# Patient Record
Sex: Male | Born: 1980 | Hispanic: No | Marital: Married | Smoking: Never smoker
Health system: Southern US, Community
[De-identification: ages and names within clinical notes are randomized; demographics above are authoritative.]

---

## 2016-06-26 DIAGNOSIS — S92515A Nondisplaced fracture of proximal phalanx of left lesser toe(s), initial encounter for closed fracture: Secondary | ICD-10-CM | POA: Diagnosis not present

## 2016-08-08 DIAGNOSIS — E785 Hyperlipidemia, unspecified: Secondary | ICD-10-CM | POA: Diagnosis not present

## 2016-08-08 DIAGNOSIS — K219 Gastro-esophageal reflux disease without esophagitis: Secondary | ICD-10-CM | POA: Diagnosis not present

## 2016-08-08 DIAGNOSIS — Z6841 Body Mass Index (BMI) 40.0 and over, adult: Secondary | ICD-10-CM | POA: Diagnosis not present

## 2016-09-06 DIAGNOSIS — Z0000 Encounter for general adult medical examination without abnormal findings: Secondary | ICD-10-CM | POA: Diagnosis not present

## 2016-09-14 DIAGNOSIS — R7309 Other abnormal glucose: Secondary | ICD-10-CM | POA: Diagnosis not present

## 2016-09-14 DIAGNOSIS — Z0001 Encounter for general adult medical examination with abnormal findings: Secondary | ICD-10-CM | POA: Diagnosis not present

## 2016-09-14 DIAGNOSIS — K219 Gastro-esophageal reflux disease without esophagitis: Secondary | ICD-10-CM | POA: Diagnosis not present

## 2016-09-14 DIAGNOSIS — E785 Hyperlipidemia, unspecified: Secondary | ICD-10-CM | POA: Diagnosis not present

## 2016-10-12 DIAGNOSIS — R062 Wheezing: Secondary | ICD-10-CM | POA: Diagnosis not present

## 2016-10-12 DIAGNOSIS — Z6841 Body Mass Index (BMI) 40.0 and over, adult: Secondary | ICD-10-CM | POA: Diagnosis not present

## 2016-10-12 DIAGNOSIS — J060 Acute laryngopharyngitis: Secondary | ICD-10-CM | POA: Diagnosis not present

## 2017-03-27 ENCOUNTER — Other Ambulatory Visit (HOSPITAL_COMMUNITY): Payer: Self-pay | Admitting: Internal Medicine

## 2017-03-27 DIAGNOSIS — R945 Abnormal results of liver function studies: Principal | ICD-10-CM

## 2017-03-27 DIAGNOSIS — R7989 Other specified abnormal findings of blood chemistry: Secondary | ICD-10-CM

## 2017-04-04 ENCOUNTER — Ambulatory Visit (HOSPITAL_COMMUNITY)
Admission: RE | Admit: 2017-04-04 | Discharge: 2017-04-04 | Disposition: A | Discharge status: Home / Self Care | Payer: BLUE CROSS/BLUE SHIELD | Source: Ambulatory Visit | Attending: Internal Medicine | Admitting: Internal Medicine

## 2017-04-04 DIAGNOSIS — K760 Fatty (change of) liver, not elsewhere classified: Secondary | ICD-10-CM | POA: Insufficient documentation

## 2017-04-04 DIAGNOSIS — R7989 Other specified abnormal findings of blood chemistry: Secondary | ICD-10-CM | POA: Insufficient documentation

## 2017-04-04 DIAGNOSIS — R945 Abnormal results of liver function studies: Secondary | ICD-10-CM

## 2017-06-27 DIAGNOSIS — R7301 Impaired fasting glucose: Secondary | ICD-10-CM | POA: Diagnosis not present

## 2017-06-27 DIAGNOSIS — E785 Hyperlipidemia, unspecified: Secondary | ICD-10-CM | POA: Diagnosis not present

## 2017-06-28 DIAGNOSIS — K219 Gastro-esophageal reflux disease without esophagitis: Secondary | ICD-10-CM | POA: Diagnosis not present

## 2018-01-12 DIAGNOSIS — K760 Fatty (change of) liver, not elsewhere classified: Secondary | ICD-10-CM | POA: Diagnosis not present

## 2018-01-12 DIAGNOSIS — R945 Abnormal results of liver function studies: Secondary | ICD-10-CM | POA: Diagnosis not present

## 2018-01-12 DIAGNOSIS — R7301 Impaired fasting glucose: Secondary | ICD-10-CM | POA: Diagnosis not present

## 2018-01-12 DIAGNOSIS — E785 Hyperlipidemia, unspecified: Secondary | ICD-10-CM | POA: Diagnosis not present

## 2018-01-12 DIAGNOSIS — R030 Elevated blood-pressure reading, without diagnosis of hypertension: Secondary | ICD-10-CM | POA: Diagnosis not present

## 2018-01-24 DIAGNOSIS — R7301 Impaired fasting glucose: Secondary | ICD-10-CM | POA: Diagnosis not present

## 2018-01-24 DIAGNOSIS — K219 Gastro-esophageal reflux disease without esophagitis: Secondary | ICD-10-CM | POA: Diagnosis not present

## 2018-01-24 DIAGNOSIS — K760 Fatty (change of) liver, not elsewhere classified: Secondary | ICD-10-CM | POA: Diagnosis not present

## 2018-01-24 DIAGNOSIS — E782 Mixed hyperlipidemia: Secondary | ICD-10-CM | POA: Diagnosis not present

## 2018-02-25 DIAGNOSIS — Z6841 Body Mass Index (BMI) 40.0 and over, adult: Secondary | ICD-10-CM | POA: Diagnosis not present

## 2018-02-25 DIAGNOSIS — H60509 Unspecified acute noninfective otitis externa, unspecified ear: Secondary | ICD-10-CM | POA: Diagnosis not present

## 2018-03-04 DIAGNOSIS — Z6841 Body Mass Index (BMI) 40.0 and over, adult: Secondary | ICD-10-CM | POA: Diagnosis not present

## 2018-03-04 DIAGNOSIS — H60509 Unspecified acute noninfective otitis externa, unspecified ear: Secondary | ICD-10-CM | POA: Diagnosis not present

## 2018-03-13 DIAGNOSIS — H6122 Impacted cerumen, left ear: Secondary | ICD-10-CM | POA: Diagnosis not present

## 2018-03-13 DIAGNOSIS — H93293 Other abnormal auditory perceptions, bilateral: Secondary | ICD-10-CM | POA: Diagnosis not present

## 2018-05-29 DIAGNOSIS — I10 Essential (primary) hypertension: Secondary | ICD-10-CM | POA: Diagnosis not present

## 2018-05-29 DIAGNOSIS — Z6841 Body Mass Index (BMI) 40.0 and over, adult: Secondary | ICD-10-CM | POA: Diagnosis not present

## 2018-06-19 DIAGNOSIS — Z6841 Body Mass Index (BMI) 40.0 and over, adult: Secondary | ICD-10-CM | POA: Diagnosis not present

## 2018-06-19 DIAGNOSIS — I10 Essential (primary) hypertension: Secondary | ICD-10-CM | POA: Diagnosis not present

## 2018-08-31 DIAGNOSIS — E785 Hyperlipidemia, unspecified: Secondary | ICD-10-CM | POA: Diagnosis not present

## 2018-08-31 DIAGNOSIS — E782 Mixed hyperlipidemia: Secondary | ICD-10-CM | POA: Diagnosis not present

## 2018-08-31 DIAGNOSIS — R945 Abnormal results of liver function studies: Secondary | ICD-10-CM | POA: Diagnosis not present

## 2018-08-31 DIAGNOSIS — I10 Essential (primary) hypertension: Secondary | ICD-10-CM | POA: Diagnosis not present

## 2018-08-31 DIAGNOSIS — R7301 Impaired fasting glucose: Secondary | ICD-10-CM | POA: Diagnosis not present

## 2018-09-04 DIAGNOSIS — E119 Type 2 diabetes mellitus without complications: Secondary | ICD-10-CM | POA: Diagnosis not present

## 2018-09-04 DIAGNOSIS — E782 Mixed hyperlipidemia: Secondary | ICD-10-CM | POA: Diagnosis not present

## 2018-09-04 DIAGNOSIS — I10 Essential (primary) hypertension: Secondary | ICD-10-CM | POA: Diagnosis not present

## 2018-09-04 DIAGNOSIS — R945 Abnormal results of liver function studies: Secondary | ICD-10-CM | POA: Diagnosis not present

## 2018-09-15 IMAGING — US US ABDOMEN COMPLETE
1 series · 14 of 25 positions shown · non-contrast
Comparison: None.

CLINICAL DATA: Elevated LFTs

EXAM:
ABDOMEN ULTRASOUND COMPLETE

[Series 1: us abdomen complete · 0.27mm/px · 14 of 97 slices shown]
[im 1/97]
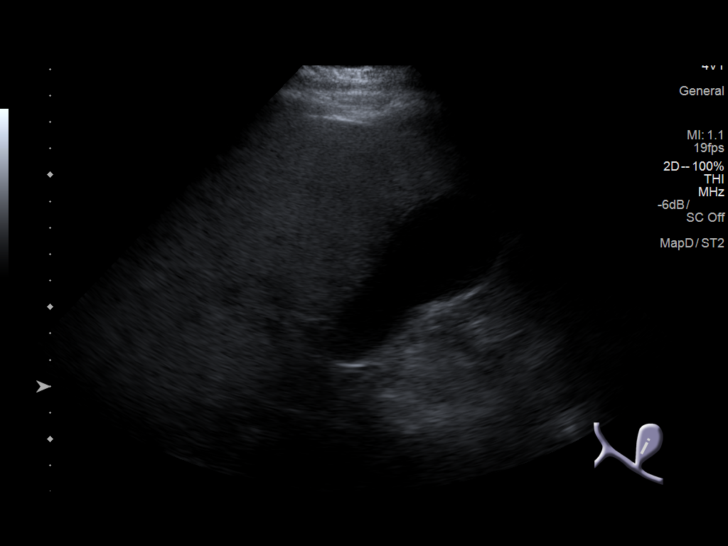
[im 9/97]
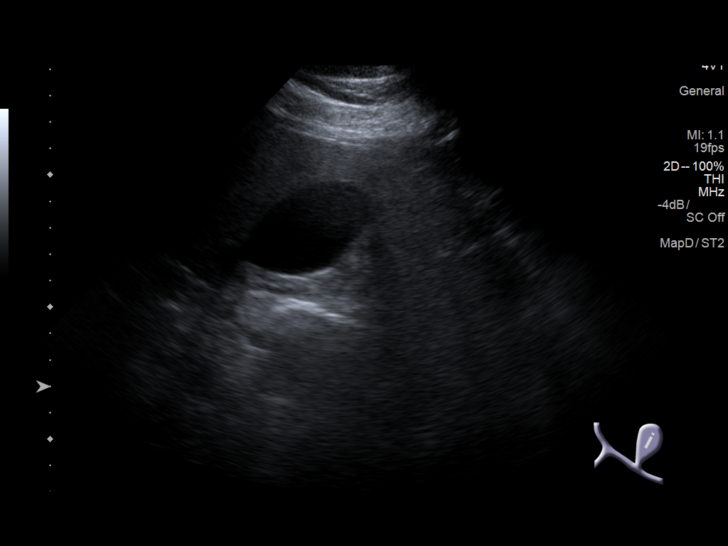
[im 17/97]
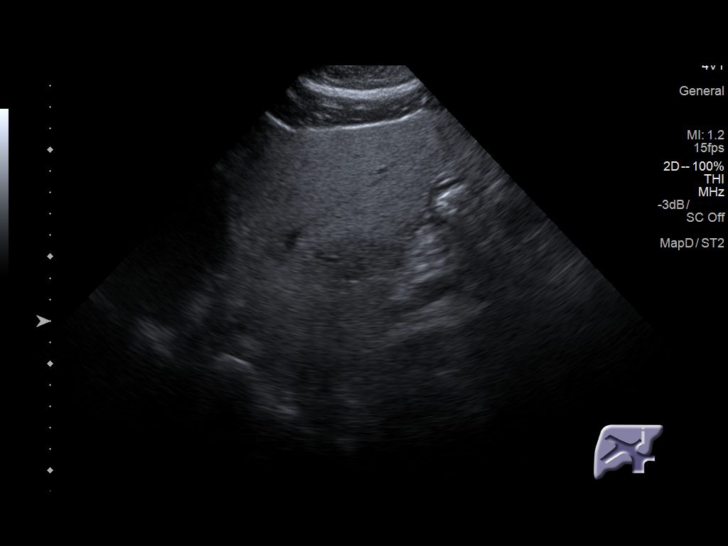
[im 25/97]
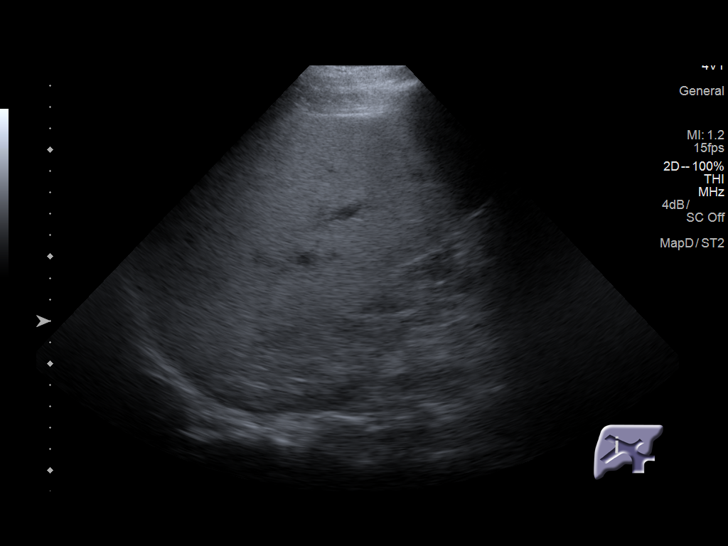
[im 33/97]
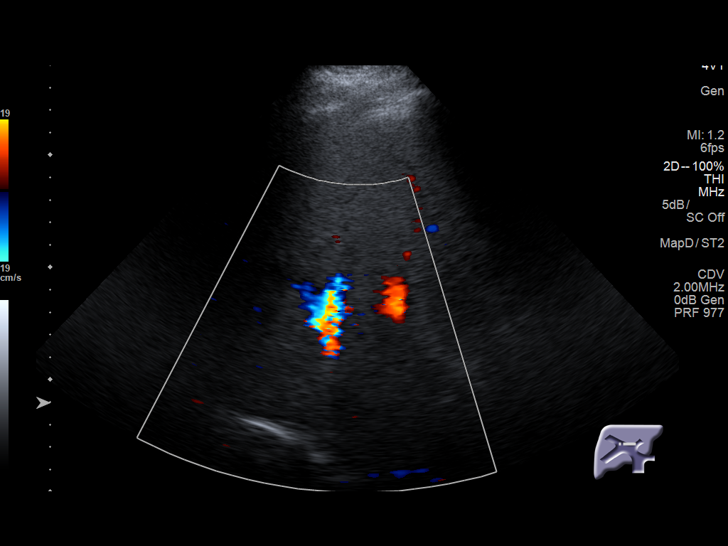
[im 37/97]
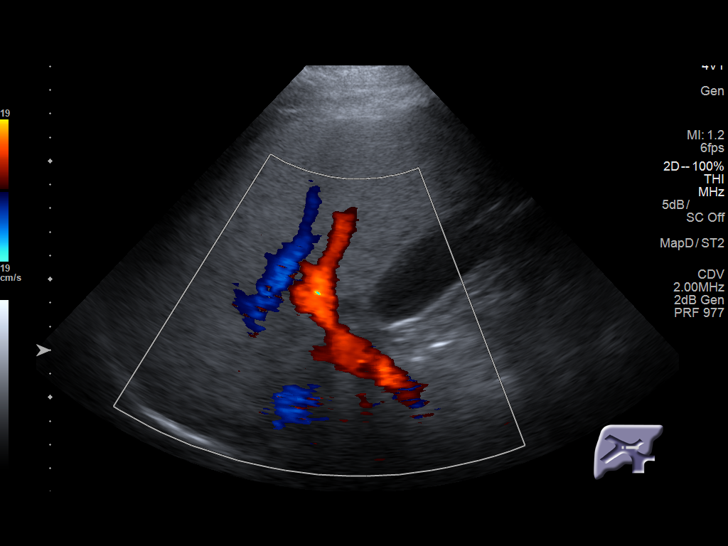
[im 45/97]
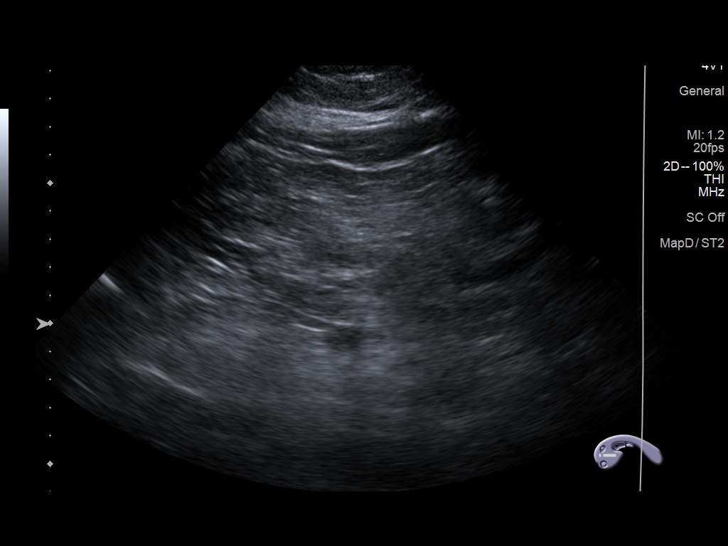
[im 53/97]
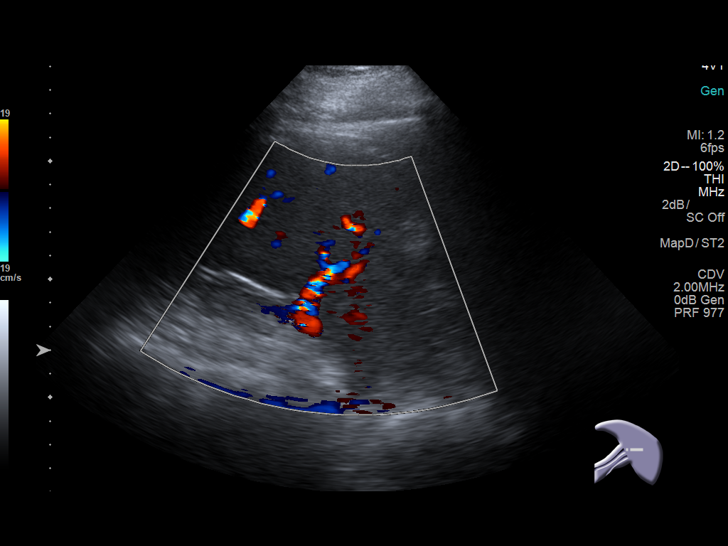
[im 61/97]
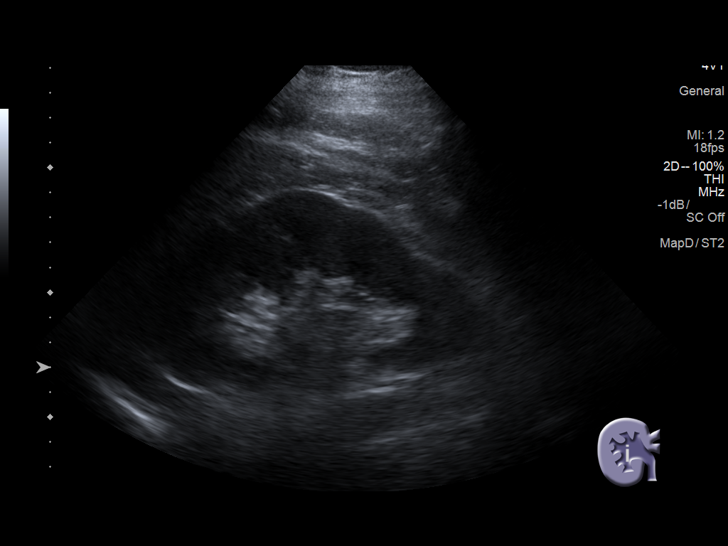
[im 65/97]
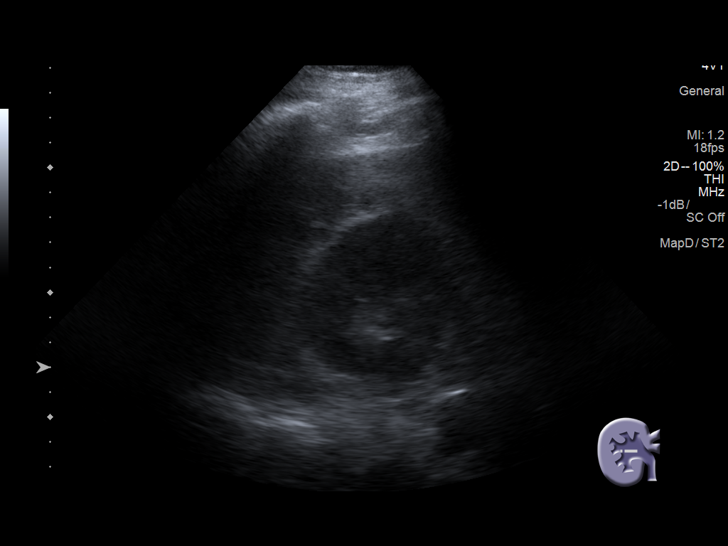
[im 73/97]
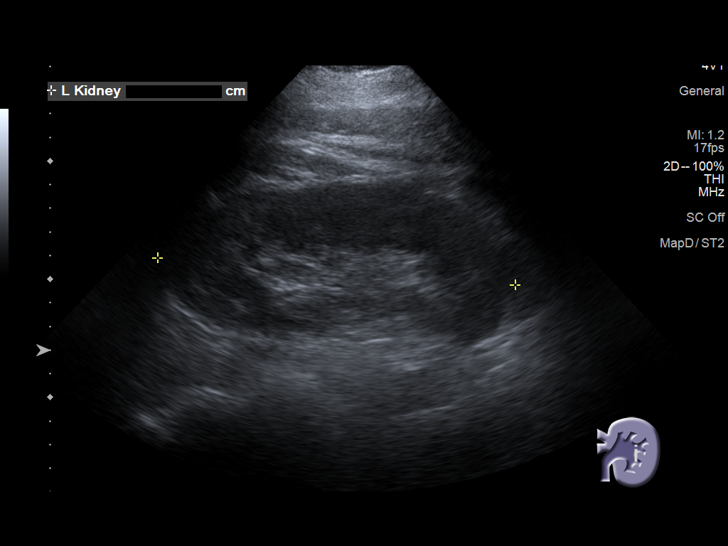
[im 81/97]
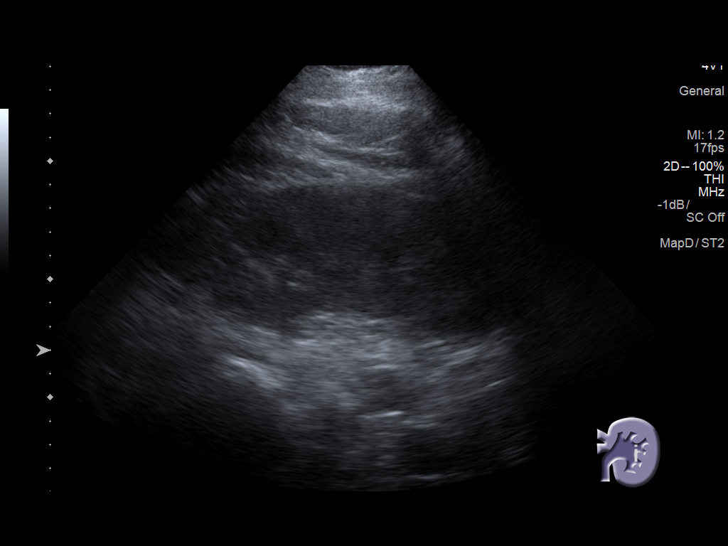
[im 89/97]
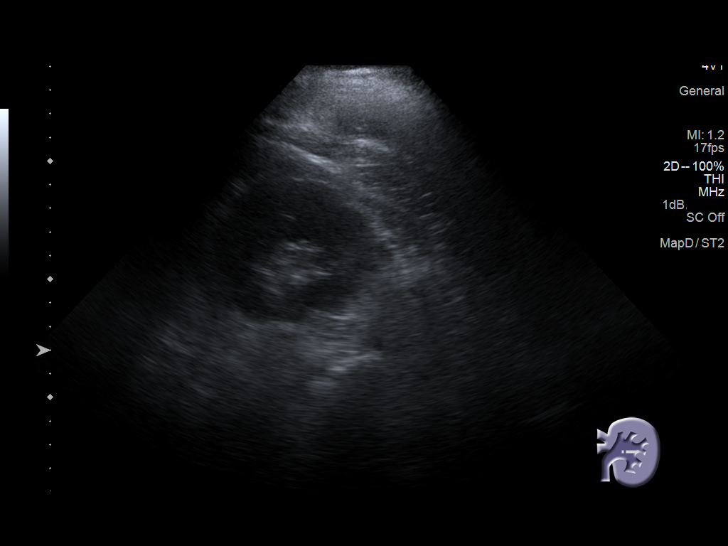
[im 97/97]
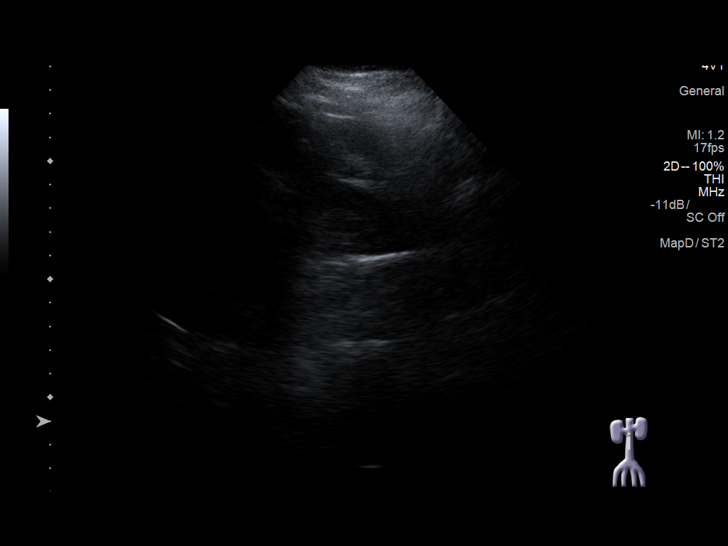

[14 of 25 positions shown; findings below may reference images not displayed]

FINDINGS: Gallbladder: No gallstones or wall thickening visualized. No
sonographic Murphy sign noted by sonographer.

Common bile duct: Diameter: 3 mm

Liver: Diffusely increased in echogenicity consistent with fatty
infiltration.

IVC: No abnormality visualized.

Pancreas: Not well visualized

Spleen: Size and appearance within normal limits.

Right Kidney: Length: 13.5 cm. Echogenicity within normal limits. No
mass or hydronephrosis visualized.

Left Kidney: Length: 15 cm. Echogenicity within normal limits. No
mass or hydronephrosis visualized.

Abdominal aorta: No aneurysm visualized.

Other findings: None.
IMPRESSION: Fatty liver.

## 2018-12-03 DIAGNOSIS — R945 Abnormal results of liver function studies: Secondary | ICD-10-CM | POA: Diagnosis not present

## 2018-12-03 DIAGNOSIS — E782 Mixed hyperlipidemia: Secondary | ICD-10-CM | POA: Diagnosis not present

## 2018-12-03 DIAGNOSIS — R030 Elevated blood-pressure reading, without diagnosis of hypertension: Secondary | ICD-10-CM | POA: Diagnosis not present

## 2018-12-03 DIAGNOSIS — I10 Essential (primary) hypertension: Secondary | ICD-10-CM | POA: Diagnosis not present

## 2018-12-03 DIAGNOSIS — K760 Fatty (change of) liver, not elsewhere classified: Secondary | ICD-10-CM | POA: Diagnosis not present

## 2018-12-03 DIAGNOSIS — R7301 Impaired fasting glucose: Secondary | ICD-10-CM | POA: Diagnosis not present

## 2018-12-11 DIAGNOSIS — I10 Essential (primary) hypertension: Secondary | ICD-10-CM | POA: Diagnosis not present

## 2018-12-11 DIAGNOSIS — E1169 Type 2 diabetes mellitus with other specified complication: Secondary | ICD-10-CM | POA: Diagnosis not present

## 2018-12-11 DIAGNOSIS — E782 Mixed hyperlipidemia: Secondary | ICD-10-CM | POA: Diagnosis not present

## 2018-12-11 DIAGNOSIS — R945 Abnormal results of liver function studies: Secondary | ICD-10-CM | POA: Diagnosis not present

## 2018-12-23 DIAGNOSIS — R220 Localized swelling, mass and lump, head: Secondary | ICD-10-CM | POA: Diagnosis not present

## 2018-12-26 DIAGNOSIS — B029 Zoster without complications: Secondary | ICD-10-CM | POA: Diagnosis not present

## 2018-12-26 DIAGNOSIS — B001 Herpesviral vesicular dermatitis: Secondary | ICD-10-CM | POA: Diagnosis not present

## 2019-07-16 DIAGNOSIS — I10 Essential (primary) hypertension: Secondary | ICD-10-CM | POA: Diagnosis not present

## 2019-07-16 DIAGNOSIS — R7301 Impaired fasting glucose: Secondary | ICD-10-CM | POA: Diagnosis not present

## 2019-07-16 DIAGNOSIS — E782 Mixed hyperlipidemia: Secondary | ICD-10-CM | POA: Diagnosis not present

## 2019-07-16 DIAGNOSIS — E785 Hyperlipidemia, unspecified: Secondary | ICD-10-CM | POA: Diagnosis not present

## 2019-07-16 DIAGNOSIS — E1169 Type 2 diabetes mellitus with other specified complication: Secondary | ICD-10-CM | POA: Diagnosis not present

## 2019-07-19 DIAGNOSIS — I10 Essential (primary) hypertension: Secondary | ICD-10-CM | POA: Diagnosis not present

## 2019-07-19 DIAGNOSIS — Z6841 Body Mass Index (BMI) 40.0 and over, adult: Secondary | ICD-10-CM | POA: Diagnosis not present

## 2019-07-19 DIAGNOSIS — K760 Fatty (change of) liver, not elsewhere classified: Secondary | ICD-10-CM | POA: Diagnosis not present

## 2019-07-19 DIAGNOSIS — Z712 Person consulting for explanation of examination or test findings: Secondary | ICD-10-CM | POA: Diagnosis not present

## 2019-07-19 DIAGNOSIS — E1169 Type 2 diabetes mellitus with other specified complication: Secondary | ICD-10-CM | POA: Diagnosis not present

## 2019-07-19 DIAGNOSIS — K219 Gastro-esophageal reflux disease without esophagitis: Secondary | ICD-10-CM | POA: Diagnosis not present

## 2019-08-30 ENCOUNTER — Other Ambulatory Visit: Payer: Self-pay

## 2019-08-30 ENCOUNTER — Ambulatory Visit: Payer: BLUE CROSS/BLUE SHIELD | Attending: Internal Medicine

## 2019-08-30 DIAGNOSIS — Z20822 Contact with and (suspected) exposure to covid-19: Secondary | ICD-10-CM

## 2019-09-01 LAB — NOVEL CORONAVIRUS, NAA: SARS-CoV-2, NAA: NOT DETECTED

## 2019-10-24 ENCOUNTER — Ambulatory Visit: Payer: BC Managed Care – PPO | Attending: Internal Medicine

## 2019-10-24 DIAGNOSIS — Z23 Encounter for immunization: Secondary | ICD-10-CM

## 2019-10-24 NOTE — Progress Notes (Signed)
/    Covid-19 Vaccination Clinic  Name:  Demtrius Rounds    MRN: [id number] DOB: March 27, 1981  10/24/2019  Mr. Joynt was observed post Covid-19 immunization for 15 minutes without incidence. He was provided with Vaccine Information Sheet and instruction to access the V-Safe system.   Mr. Sedgwick was instructed to call 911 with any severe reactions post vaccine: Marland Kitchen Difficulty breathing  . Swelling of your face and throat  . A fast heartbeat  . A bad rash all over your body  . Dizziness and weakness    Immunizations Administered    Name Date Dose VIS Date Route   Moderna COVID-19 Vaccine 10/24/2019 12:25 PM 0.5 mL 07/27/2019 Intramuscular   Manufacturer: Moderna   Lot: [id number]   NDC: 19155-027-14

## 2019-11-12 DIAGNOSIS — Z20828 Contact with and (suspected) exposure to other viral communicable diseases: Secondary | ICD-10-CM | POA: Diagnosis not present

## 2019-11-12 DIAGNOSIS — J019 Acute sinusitis, unspecified: Secondary | ICD-10-CM | POA: Diagnosis not present

## 2019-11-12 DIAGNOSIS — Z03818 Encounter for observation for suspected exposure to other biological agents ruled out: Secondary | ICD-10-CM | POA: Diagnosis not present

## 2019-11-27 ENCOUNTER — Ambulatory Visit: Payer: BLUE CROSS/BLUE SHIELD

## 2024-05-14 ENCOUNTER — Other Ambulatory Visit (HOSPITAL_BASED_OUTPATIENT_CLINIC_OR_DEPARTMENT_OTHER): Payer: Self-pay | Admitting: Internal Medicine

## 2024-05-14 DIAGNOSIS — E782 Mixed hyperlipidemia: Secondary | ICD-10-CM

## 2024-06-04 ENCOUNTER — Ambulatory Visit (HOSPITAL_BASED_OUTPATIENT_CLINIC_OR_DEPARTMENT_OTHER)
Admission: RE | Admit: 2024-06-04 | Discharge: 2024-06-04 | Disposition: A | Discharge status: Home / Self Care | Payer: Self-pay | Source: Ambulatory Visit | Attending: Internal Medicine | Admitting: Internal Medicine

## 2024-06-04 DIAGNOSIS — E782 Mixed hyperlipidemia: Secondary | ICD-10-CM | POA: Insufficient documentation

## 2024-07-21 ENCOUNTER — Ambulatory Visit (INDEPENDENT_AMBULATORY_CARE_PROVIDER_SITE_OTHER): Admitting: Podiatry

## 2024-07-21 DIAGNOSIS — M2012 Hallux valgus (acquired), left foot: Secondary | ICD-10-CM | POA: Diagnosis not present

## 2024-07-21 DIAGNOSIS — L97522 Non-pressure chronic ulcer of other part of left foot with fat layer exposed: Secondary | ICD-10-CM

## 2024-07-21 DIAGNOSIS — L97502 Non-pressure chronic ulcer of other part of unspecified foot with fat layer exposed: Secondary | ICD-10-CM

## 2024-07-21 MED ORDER — MUPIROCIN 2 % EX OINT: 1.0000 | TOPICAL_OINTMENT | Freq: Two times a day (BID) | CUTANEOUS | 2 refills | Status: DC

## 2024-07-21 NOTE — Progress Notes (Signed)
 HPI: Patient presents for ulcer f hallux left start as a blister and then started draining.  He went to urgent care they give him an antibiotic injection and a prescription for doxycycline.  Has had this happen in the past also.  Has been a recurrent issue. 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   Objective:   Vascular:  DP pulses 2/4 bilaterally. PT pulses 2/4 bilaterally.  Capillary refill time immediate bilaterally.  Moderate edema lower extremity bilaterally   Neurologic: Decreased protective sensation feet bilaterally  Dermatologic: Full-thickness ulceration plantar medial aspect hallux left.  Penetrates into subcutaneous tissue.  Wound base granulation and mixed and devitalized tissue mixture.  Moderate serous exudate.  Predebridement ulcer measures 10 mm x 10 mm x 2 deep.  Musculoskeletal: Hallux abductovalgus deformity left    Assessment:  Full-thickness Wagner grade 2 ulceration hallux left. 2.  Hallux valgus left  Plan:  Patient was evaluated and treated and all questions answered.  [place]usset with him the hallux abductovalgus deformity which is probably contributing to this recurrent ulceration.  This once we have the ulcer healed this will probably need to be addressed in the future.  Ulcer Wagner grade 2 penetrating subcutaneous tissue hallux left -Debridement as below. -Dressed with antibiotic ointment and DSD. - Dispensed surgical shoe left to offload ulcer. -Wound care: Soak twice daily luke warm epsom salt water 15 min . Apply Bactroban  ointment and dressing. -Finish doxycycline as Rx'd  Procedure: Excisional Debridement of Wound Tool: Sharp #312 chisel blade/tissue nipper Type of Debridement: Sharp Excisional Frequency: Every 1 to 2 weeks until appropriately healed.   Rationale: Removal of non-viable soft tissue from the wound to promote healing.  Anesthesia: none  Pre-Debridement Wound Measurements: 10 mm x 10 mm  x 2 mm deep Post-Debridement Wound Measurements: 18 mm x 18 mm x 4 mm deep Area devitalized tissue removed(nonviable tissue only): 18 mm x 18 mm.  Blood loss: Minimal (<50cc) Depth of Debridement: Full-thickness into subcutaneous tissue  Description of tissue removed: Devitalized tissue Technique: The wound and the surrounding skin were prepped and draped in usual aseptic fashion.  Aseptic technique was maintained throughout the procedure.  Using #312 blade/tissue nipper sharp debridement of necrotic/nonviable tissue was performed until healthy bleeding wound bed was achieved.  No underlying bone or tendon was exposed during debridement.  The wound was thoroughly irrigated with normal saline solution Wound Progress:  Debridement removed 18 mm x 18 mm of the necrotic tissue and subcutaneous tissue.  Purulent drainage not present. Nonviable tissue present at the base of the wound.  Sharp debridement was performed to remove the necrotic tissue/nonviable tissue back to viable tissue.  No devitalized/nonviable tissue present postdebridement.  Wound appeared clean and clear of infection No material in the wound was present that was identified to be inhibiting healing. Dressing: Dry, sterile, compression dressing. Disposition: Patient tolerated procedure well.   Return 1 weeks f/u ulcer left and radiographs 3 views left foot

## 2024-07-28 ENCOUNTER — Ambulatory Visit (INDEPENDENT_AMBULATORY_CARE_PROVIDER_SITE_OTHER)

## 2024-07-28 ENCOUNTER — Ambulatory Visit: Admitting: Podiatry

## 2024-07-28 DIAGNOSIS — L97522 Non-pressure chronic ulcer of other part of left foot with fat layer exposed: Secondary | ICD-10-CM

## 2024-07-28 DIAGNOSIS — M2012 Hallux valgus (acquired), left foot: Secondary | ICD-10-CM

## 2024-07-28 NOTE — Progress Notes (Signed)
 Patient presents with complaint and follow-up of ulcer on the hallux left.  Has been doing wound care and staying off is much as he can.  Has been soaking twice a day and putting Bactroban  and a dressing on it.  No fever chills nausea or vomiting.  Has noticed less drainage from it.  Says the swelling is gone down.      bjective:    Vascular:  DP pulses 2/4 bilaterally. PT pulses 2/4 bilaterally.  Capillary refill time immediate bilaterally.  Moderate edema lower extremity bilaterally     Neurologic: Decreased protective sensation feet bilaterally   Dermatologic: Full-thickness ulceration plantar medial aspect hallux left.  Penetrates into subcutaneous tissue.  Wound base granulation and mixed and devitalized tissue mixture.  Moderate serous exudate.  Predebridement ulcer measures 5 mm x 5 mm x 2 deep.   Musculoskeletal: Hallux abductovalgus deformity left       Assessment:  Full-thickness Wagner grade 2 ulceration hallux left. 2.  Hallux valgus left   Plan:  Patient was evaluated and treated and all questions answered.  [place] with him the hallux abductovalgus deformity which is probably contributing to this recurrent ulceration.  This once we have the ulcer healed this will probably need to be addressed in the future.   Ulcer Wagner grade 2 penetrating subcutaneous tissue hallux left -Debridement as below. -Dressed with antibiotic ointment and DSD. - Dispensed surgical shoe left to offload ulcer. -Wound care: Soak twice daily luke warm epsom salt water 15 min . Apply Bactroban  ointment and dressing.    Procedure: Excisional Debridement of Wound Tool: Sharp #312 chisel blade/tissue nipper Type of Debridement: Sharp Excisional Frequency: Every 1 to 2 weeks until appropriately healed.   Rationale: Removal of non-viable soft tissue from the wound to promote healing.  Anesthesia: none   Pre-Debridement Wound Measurements: 5 mm x 5 mm x 2 mm deep Post-Debridement Wound  Measurements: 12 mm x 12 mm x 3 mm deep Area devitalized tissue removed(nonviable tissue only): 12 mm x 12 mm.  Blood loss: Minimal (<50cc) Depth of Debridement: Full-thickness into subcutaneous tissue  Description of tissue removed: Devitalized tissue Technique: The wound and the surrounding skin were prepped and draped in usual aseptic fashion.  Aseptic technique was maintained throughout the procedure.  Using #312 blade/tissue nipper sharp debridement of necrotic/nonviable tissue was performed until healthy bleeding wound bed was achieved.  No underlying bone or tendon was exposed during debridement.  The wound was thoroughly irrigated with normal saline solution Wound Progress:  Debridement removed 12 mm x 12 mm of the necrotic tissue and subcutaneous tissue.  Purulent drainage not present. Nonviable tissue present at the base of the wound.  Sharp debridement was performed to remove the necrotic tissue/nonviable tissue back to viable tissue.  No devitalized/nonviable tissue present postdebridement.  Wound appeared clean and clear of infection No material in the wound was present that was identified to be inhibiting healing. Dressing: Dry, sterile, compression dressing. Disposition: Patient tolerated procedure well.    Return 1 weeks f/u ulcer lef

## 2024-08-09 ENCOUNTER — Ambulatory Visit: Admitting: Podiatry

## 2024-08-09 ENCOUNTER — Encounter: Payer: Self-pay | Admitting: Podiatry

## 2024-08-09 DIAGNOSIS — L97522 Non-pressure chronic ulcer of other part of left foot with fat layer exposed: Secondary | ICD-10-CM

## 2024-08-09 NOTE — Progress Notes (Signed)
 Patient presents with complaint and follow-up of ulcer on the hallux left.  Has been doing wound care and staying off is much as he can.  Has been soaking twice a day and putting Bactroban  and a dressing on it.  No fever chills nausea or vomiting.  Has noticed less drainage from it.  Says the swelling is gone down.           bjective:    Vascular:  DP pulses 2/4 bilaterally. PT pulses 2/4 bilaterally.  Capillary refill time immediate bilaterally.  Moderate edema lower extremity bilaterally     Neurologic: Decreased protective sensation feet bilaterally   Dermatologic: Full-thickness ulceration plantar medial aspect hallux left.  Penetrates into subcutaneous tissue.  Wound base granulation and mixed and devitalized tissue mixture.  Moderate serous exudate.  Predebridement ulcer measures 5 mm x 5 mm x 2 deep.   Musculoskeletal: Hallux abductovalgus deformity left       Assessment:  Full-thickness Wagner grade 2 ulceration hallux left.    Plan:     Ulcer Wagner grade 2 penetrating subcutaneous tissue hallux left -Debridement as below. -Dressed with antibiotic ointment and DSD. - Continue surgical shoe left to offload ulcer. -Wound care: Soak twice daily luke warm epsom salt water 15 min . Apply Bactroban  ointment and dressing.     Procedure: Excisional Debridement of Wound Tool: Sharp #312 chisel blade/tissue nipper Type of Debridement: Sharp Excisional Frequency: Every 1 to 2 weeks until appropriately healed.   Rationale: Removal of non-viable soft tissue from the wound to promote healing.  Anesthesia: none   Pre-Debridement Wound Measurements: 5 mm x 5 mm x 2 mm deep Post-Debridement Wound Measurements: 7 mm x 7 mm x 3 mm deep Area devitalized tissue removed(nonviable tissue only):24mm x 7mm.  Blood loss: Minimal (<50cc) Depth of Debridement: Full-thickness into subcutaneous tissue  Description of tissue removed: Devitalized tissue Technique: The wound and the  surrounding skin were prepped and draped in usual aseptic fashion.  Aseptic technique was maintained throughout the procedure.  Using #312 blade/tissue nipper sharp debridement of necrotic/nonviable tissue was performed until healthy bleeding wound bed was achieved.  No underlying bone or tendon was exposed during debridement.  The wound was thoroughly irrigated with normal saline solution Wound Progress:  Debridement removed 7 mm x 7 mm of the necrotic tissue and subcutaneous tissue.  Purulent drainage not present. Nonviable tissue present at the base of the wound.  Sharp debridement was performed to remove the necrotic tissue/nonviable tissue back to viable tissue.  No devitalized/nonviable tissue present postdebridement.  Wound appeared clean and clear of infection No material in the wound was present that was identified to be inhibiting healing. Dressing: Dry, sterile, compression dressing. Disposition: Patient tolerated procedure well.    Return 1 weeks f/u ulcer lef

## 2024-08-18 ENCOUNTER — Ambulatory Visit: Admitting: Podiatry

## 2024-08-18 DIAGNOSIS — L97522 Non-pressure chronic ulcer of other part of left foot with fat layer exposed: Secondary | ICD-10-CM

## 2024-08-18 NOTE — Progress Notes (Signed)
 HPI: Patient presents for ulcer follow-up.  Ulcer hallux left says it is healing well decreased drainage.  Decreased swelling in the toe.  Has had clear drainage but no signs of purulence or infection    Denies N/V/F/Ch.   Objective:   Vascular:  DP pulses 2/4 bilaterally. PT pulses 2 to/4 bilaterally.  Capillary refill time immediate bilaterally.   Neurologic: Decreased protective sensation feet bilaterally  Dermatologic: Full-thickness ulceration plantar medial aspect hallux left penetrating subcutaneous tissue.. Wound base mixed granulation and nonviable tissue..  Mild serous exudate.  Purulent drainage not present. Predebridement ulcer measures 3 mm x 3 mm x 2 deep.  Musculoskeletal:      Assessment:  Full-thickness Wagner grade 2 ulceration plantar medial aspect hallux left.  Plan:  Patient was evaluated and treated and all questions answered.  Ulcer plantar medial hallux left -Ulcer progress: Ulcer healing well with no signs of infection -Debridement as below. -Dressed with antibiotic ointment and DSD. - Continue   surgical shoe for offloading .. -Wound care: Soak twice daily luke warm epsom salt water 15 min . Apply Bactroban  ointment and dressing.  Procedure: Excisional Debridement of Wound Tool: Sharp #312 chisel blade/tissue nipper Type of Debridement: Sharp Excisional Frequency: Every 1 to 2 weeks until appropriately healed.   Rationale: Removal of non-viable soft tissue from the wound to promote healing.  Anesthesia: none  Pre-Debridement Wound Measurements: 3 mm x 3 mm x 2 mm deep Post-Debridement Wound Measurements: 5 mm x 5 mm x 3 mm deep Area devitalized tissue removed(nonviable tissue only): 5 mm x 5 mm.  Blood loss: Minimal (<50cc) Depth of Debridement: Full-thickness into subcutaneous tissue  Description of tissue removed: Devitalized tissue Technique: Using #312 blade/tissue nipper sharp debridement of necrotic/nonviable tissue was performed  until healthy bleeding wound bed at base of wound was achieved.  After debridement no nonviable was present in wound.  No underlying bone or tendon was exposed during debridement.  The wound was thoroughly irrigated with normal saline solution Dressing: Dry, sterile, compression dressing. Disposition: Patient tolerated procedure well.   Return to weeks f/u ulcer hallux left

## 2024-09-03 ENCOUNTER — Ambulatory Visit: Admitting: Podiatry

## 2024-09-03 ENCOUNTER — Encounter: Payer: Self-pay | Admitting: Podiatry

## 2024-09-03 DIAGNOSIS — L97522 Non-pressure chronic ulcer of other part of left foot with fat layer exposed: Secondary | ICD-10-CM | POA: Diagnosis not present

## 2024-09-03 MED ORDER — MUPIROCIN 2 % EX OINT: 1.0000 | TOPICAL_OINTMENT | Freq: Two times a day (BID) | CUTANEOUS | 2 refills | Status: AC

## 2024-09-03 NOTE — Progress Notes (Signed)
 HPI: Patient presents for ulcer follow-up.  Follow-up ulceration hallux left.  Wearing surgical shoe and doing wound care.  He will be going to a conference in [place] and doing a lot of walking.    Denies N/V/F/Ch.   Objective:   Vascular:  DP pulses 2/4 bilaterally. PT pulses 2 to/4 bilaterally.  Capillary refill time immediate bilaterally.   Neurologic: Decreased protective sensation feet bilaterally  Dermatologic: Full-thickness ulceration plantar medial aspect hallux left. Wound base 75% granulation tissue and some fibrinous tissue.  Moderate clear exudate.  Purulent drainage not present. Predebridement ulcer measures 4 mm x 4 mm x 2 deep.  Musculoskeletal:     Assessment:  Full-thickness Wagner grade 2 ulceration plantar medial aspect hallux left.  Plan:  Patient was evaluated and treated and all questions answered.  Ulcer plantar medial aspect hallux left. -Ulcer progress:  appearance improved with increased granulation tissue -Debridement as below. -Dressed with antibiotic ointment and DSD. - Dispensed pneumatic walker for all weightbearing left to offload ulcer -Wound care: Soak twice daily luke warm epsom salt water 15 min . Apply Bactroban  ointment and dressing.  Procedure: Excisional Debridement of Wound Tool: Sharp #312 chisel blade/tissue nipper Type of Debridement: Sharp Excisional Frequency: Every 1 to 2 weeks until appropriately healed.   Rationale: Removal of non-viable soft tissue from the wound to promote healing.  Anesthesia: none  Pre-Debridement Wound Measurements: 4 mm x 4 mm x 2 mm deep Post-Debridement Wound Measurements: 6 mm x 6 mm x 3 mm deep Area devitalized tissue removed(nonviable tissue only): 6 mm x 2 mm.  Blood loss: Minimal (<50cc) Depth of Debridement: Full-thickness into subcutaneous tissue  Description of tissue removed: Devitalized tissue Technique: Using #312 blade/tissue nipper sharp debridement of necrotic/nonviable tissue  was performed until healthy bleeding wound bed at base of wound was achieved.  After debridement no nonviable was present in wound.  No underlying bone or tendon was exposed during debridement.  The wound was thoroughly irrigated with normal saline solution Dressing: Dry, sterile, compression dressing. Disposition: Patient tolerated procedure well.   Return to weeks f/u ulcer

## 2024-09-03 NOTE — Patient Instructions (Signed)
$  315.46 Walker boot.

## 2024-09-17 ENCOUNTER — Ambulatory Visit: Admitting: Podiatry

## 2024-09-17 ENCOUNTER — Encounter: Payer: Self-pay | Admitting: Podiatry

## 2024-09-17 DIAGNOSIS — L97522 Non-pressure chronic ulcer of other part of left foot with fat layer exposed: Secondary | ICD-10-CM | POA: Diagnosis not present

## 2024-09-17 MED ORDER — SANTYL 250 UNIT/GM EX OINT: 1.0000 | TOPICAL_OINTMENT | Freq: Every day | CUTANEOUS | 0 refills | Status: AC

## 2024-09-17 NOTE — Progress Notes (Signed)
 HPI: Patient presents for ulcer follow-up.  Follow-up ulcer hallux left.  This seems to be doing well.  Has been doing wound care as instructed.  Wearing his pneumatic cast.    Denies N/V/F/Ch.   Objective:   Vascular:  DP pulses 2/4 bilaterally. PT pulses 2/4 bilaterally.  Capillary refill time immediate bilaterally.   Neurologic: Decreased protective sensation feet bilaterally  Dermatologic: Full-thickness ulceration medial aspect hallux left. Wound base mixed granulation and devitalized tissue..  Moderate exudate.  Purulent drainage not present. Predebridement ulcer measures 4 mm x 2 mm x 2 deep.  Musculoskeletal:     Assessment:  Full-thickness Wagner grade 2 ulceration plantar medial aspect hallux left.  Plan:  Patient was evaluated and treated and all questions answered.  Ulcer full-thickness plantar medial aspect hallux left. -Ulcer progress: Improved -Debridement as below. -Dressed with antibiotic ointment and DSD. # Continue pneumatic cast left for offloading -Wound care: Soak daily ointment luke warm epsom salt water 15 min . Apply Santyl ointment and dressing. -Rx Santyl ointment, apply daily to ulcer, refill x 2  Procedure: Excisional Debridement of Wound Tool: Sharp #312 chisel blade/tissue nipper Type of Debridement: Sharp Excisional Frequency: Every 1 to 2 weeks until appropriately healed.   Rationale: Removal of non-viable soft tissue from the wound to promote healing.  Anesthesia: none  Pre-Debridement Wound Measurements: 4 mm x 2 mm x 2 mm deep Post-Debridement Wound Measurements: 8 mm x 3 mm x 3 mm deep Area devitalized tissue removed(nonviable tissue only): 8 mm x 3 mm.  Blood loss: Minimal (<50cc) Depth of Debridement: Full-thickness into subcutaneous tissue  Description of tissue removed: Devitalized tissue Technique: Using #312 blade/tissue nipper sharp debridement of necrotic/nonviable tissue was performed until healthy bleeding wound bed at  base of wound was achieved.  After debridement no nonviable was present in wound.  No underlying bone or tendon was exposed during debridement.  The wound was thoroughly irrigated with normal saline solution Dressing: Dry, sterile, compression dressing. Disposition: Patient tolerated procedure well.   Return 2 weeks weeks f/u ulcer left

## 2024-10-01 ENCOUNTER — Ambulatory Visit: Admitting: Podiatry

## 2024-10-01 DIAGNOSIS — L97502 Non-pressure chronic ulcer of other part of unspecified foot with fat layer exposed: Secondary | ICD-10-CM

## 2024-10-01 MED ORDER — SULFAMETHOXAZOLE-TRIMETHOPRIM 800-160 MG PO TABS: 1.0000 | ORAL_TABLET | Freq: Two times a day (BID) | ORAL | 0 refills | Status: AC
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
## 2024-10-08 ENCOUNTER — Ambulatory Visit: Admitting: Podiatry
